# Patient Record
Sex: Male | Born: 1980 | Race: White | Hispanic: No | Marital: Single
Health system: Southern US, Community
[De-identification: ages and names within clinical notes are randomized; demographics above are authoritative.]

---

## 2018-05-04 ENCOUNTER — Emergency Department (HOSPITAL_COMMUNITY)
Admission: EM | Admit: 2018-05-04 | Discharge: 2018-05-04 | Disposition: A | Discharge status: Home / Self Care | Payer: Self-pay | Attending: Emergency Medicine | Admitting: Emergency Medicine

## 2018-05-04 ENCOUNTER — Encounter (HOSPITAL_COMMUNITY): Payer: Self-pay | Admitting: Emergency Medicine

## 2018-05-04 ENCOUNTER — Other Ambulatory Visit: Payer: Self-pay

## 2018-05-04 ENCOUNTER — Emergency Department (HOSPITAL_COMMUNITY): Payer: Self-pay

## 2018-05-04 DIAGNOSIS — W0110XA Fall on same level from slipping, tripping and stumbling with subsequent striking against unspecified object, initial encounter: Secondary | ICD-10-CM | POA: Insufficient documentation

## 2018-05-04 DIAGNOSIS — Y939 Activity, unspecified: Secondary | ICD-10-CM | POA: Insufficient documentation

## 2018-05-04 DIAGNOSIS — Y999 Unspecified external cause status: Secondary | ICD-10-CM | POA: Insufficient documentation

## 2018-05-04 DIAGNOSIS — S0990XA Unspecified injury of head, initial encounter: Secondary | ICD-10-CM | POA: Insufficient documentation

## 2018-05-04 DIAGNOSIS — Y92830 Public park as the place of occurrence of the external cause: Secondary | ICD-10-CM | POA: Insufficient documentation

## 2018-05-04 LAB — CBC WITH DIFFERENTIAL/PLATELET
Abs Immature Granulocytes: 0.02 10*3/uL (ref 0.00–0.07)
Basophils Absolute: 0 10*3/uL (ref 0.0–0.1)
Basophils Relative: 0 %
EOS ABS: 0 10*3/uL (ref 0.0–0.5)
EOS PCT: 1 %
HCT: 43.2 % (ref 39.0–52.0)
HEMOGLOBIN: 14.2 g/dL (ref 13.0–17.0)
Immature Granulocytes: 0 %
LYMPHS ABS: 0.5 10*3/uL — AB (ref 0.7–4.0)
LYMPHS PCT: 11 %
MCH: 30.1 pg (ref 26.0–34.0)
MCHC: 32.9 g/dL (ref 30.0–36.0)
MCV: 91.7 fL (ref 80.0–100.0)
Monocytes Absolute: 0.6 10*3/uL (ref 0.1–1.0)
Monocytes Relative: 12 %
Neutro Abs: 3.7 10*3/uL (ref 1.7–7.7)
Neutrophils Relative %: 76 %
Platelets: 93 10*3/uL — ABNORMAL LOW (ref 150–400)
RBC: 4.71 MIL/uL (ref 4.22–5.81)
RDW: 12 % (ref 11.5–15.5)
WBC: 4.9 10*3/uL (ref 4.0–10.5)
nRBC: 0 % (ref 0.0–0.2)

## 2018-05-04 LAB — BASIC METABOLIC PANEL
Anion gap: 17 — ABNORMAL HIGH (ref 5–15)
BUN: 9 mg/dL (ref 6–20)
CO2: 24 mmol/L (ref 22–32)
Calcium: 9.2 mg/dL (ref 8.9–10.3)
Chloride: 98 mmol/L (ref 98–111)
Creatinine, Ser: 0.91 mg/dL (ref 0.61–1.24)
GFR calc Af Amer: >60 mL/min (ref >60)
GFR calc non Af Amer: >60 mL/min (ref >60)
Glucose, Bld: 133 mg/dL — ABNORMAL HIGH (ref 70–99)
POTASSIUM: 2.9 mmol/L — AB (ref 3.5–5.1)
Sodium: 139 mmol/L (ref 135–145)

## 2018-05-04 LAB — CBG MONITORING, ED: Glucose-Capillary: 133 mg/dL — ABNORMAL HIGH (ref 70–99)

## 2018-05-04 MED ORDER — SODIUM CHLORIDE 0.9 % IV BOLUS
1000.0000 mL | Freq: Once | INTRAVENOUS | Status: AC
  Administered 2018-05-04: 1000 mL via INTRAVENOUS

## 2018-05-04 NOTE — ED Notes (Signed)
Checked patient cbg it was 133 notified RN of blood sugar 

## 2018-05-04 NOTE — ED Provider Notes (Addendum)
[facility] EMERGENCY DEPARTMENT Provider Note   CSN: [id number] Arrival date & time: 05/04/18  1243    History   Chief Complaint Chief Complaint  Patient presents with  . Fall    HPI Justin Moody is a 38 y.o. male.     38 yo M with a chief complaint of a fall.  Patient states he was drinking earlier this morning and he lost his balance and he fell.  EMS was called for some reason and he was taken to the ED.  He denies injury he denies current intoxication denies headache neck pain chest pain abdominal pain shortness of breath.  States he can ambulate without difficulty.  The history is provided by the patient.  Fall  This is a new problem. The current episode started 1 to 2 hours ago. The problem occurs rarely. The problem has been resolved. Pertinent negatives include no chest pain, no abdominal pain, no headaches and no shortness of breath. Nothing aggravates the symptoms. Nothing relieves the symptoms. He has tried nothing for the symptoms. The treatment provided no relief.    History reviewed. No pertinent past medical history.  There are no active problems to display for this patient.   History reviewed. No pertinent surgical history.      Home Medications    Prior to Admission medications   Not on File    Family History No family history on file.  Social History Social History   Tobacco Use  . Smoking status: Not on file  Substance Use Topics  . Alcohol use: Not on file  . Drug use: Not on file     Allergies   Patient has no known allergies.   Review of Systems Review of Systems  Constitutional: Negative for chills and fever.  HENT: Negative for congestion and facial swelling.   Eyes: Negative for discharge and visual disturbance.  Respiratory: Negative for shortness of breath.   Cardiovascular: Negative for chest pain and palpitations.  Gastrointestinal: Negative for abdominal pain, diarrhea and vomiting.    Musculoskeletal: Negative for arthralgias and myalgias.  Skin: Positive for wound. Negative for color change and rash.  Neurological: Negative for tremors, syncope and headaches.  Psychiatric/Behavioral: Negative for confusion and dysphoric mood.     Physical Exam Updated Vital Signs BP (!) 149/92   Pulse 96   Temp 98.3 F (36.8 C) (Oral)   Resp 16   SpO2 97%   Physical Exam Vitals signs and nursing note reviewed.  Constitutional:      Appearance: He is well-developed.  HENT:     Head: Normocephalic.     Comments: Superficial laceration to the left cheek  No midline spinal tenderness Eyes:     Pupils: Pupils are equal, round, and reactive to light.  Neck:     Musculoskeletal: Normal range of motion and neck supple.     Vascular: No JVD.  Cardiovascular:     Rate and Rhythm: Normal rate and regular rhythm.     Heart sounds: No murmur. No friction rub. No gallop.   Pulmonary:     Effort: No respiratory distress.     Breath sounds: No wheezing.  Abdominal:     General: There is no distension.     Tenderness: There is no guarding or rebound.  Musculoskeletal: Normal range of motion.  Skin:    Coloration: Skin is not pale.     Findings: No rash.  Neurological:     Mental Status: He is alert  and oriented to person, place, and time.  Psychiatric:        Behavior: Behavior normal.      ED Treatments / Results  Labs (all labs ordered are listed, but only abnormal results are displayed) Labs Reviewed  CBC WITH DIFFERENTIAL/PLATELET - Abnormal; Notable for the following components:      Result Value   Platelets 93 (*)    Lymphs Abs 0.5 (*)    All other components within normal limits  BASIC METABOLIC PANEL - Abnormal; Notable for the following components:   Potassium 2.9 (*)    Glucose, Bld 133 (*)    Anion gap 17 (*)    All other components within normal limits  CBG MONITORING, ED - Abnormal; Notable for the following components:   Glucose-Capillary 133 (*)     All other components within normal limits    EKG EKG Interpretation  Date/Time:  Tuesday May 04 2018 12:50:19 EST Ventricular Rate:  119 PR Interval:    QRS Duration: 92 QT Interval:  314 QTC Calculation: 442 R Axis:   97 Text Interpretation:  Sinus tachycardia Probable left atrial enlargement Anterolateral infarct, old st changes like rate related Otherwise no significant change Confirmed by Melene PlanFloyd, Serrena Linderman [phone]([id number]) on 05/04/2018 2:09:31 PM   Radiology Ct Head Wo Contrast  Result Date: 05/04/2018 CLINICAL DATA:  Pain following fall EXAM: CT HEAD WITHOUT CONTRAST TECHNIQUE: Contiguous axial images were obtained from the base of the skull through the vertex without intravenous contrast. COMPARISON:  None. FINDINGS: Brain: There is mild diffuse atrophy for age. There is no intracranial mass, hemorrhage, extra-axial fluid collection, or midline shift. Brain parenchyma appears unremarkable. No evident acute infarct. Vascular: There is no hyperdense vessel. There is no appreciable vascular calcification. Skull: The bony calvarium appears intact. Sinuses/Orbits: There is mucosal thickening involving several ethmoid air cells. Other visualized paranasal sinuses are clear. Orbits appear symmetric bilaterally. Other: Visualized mastoid air cells are clear. IMPRESSION: Mild generalized atrophy for age. Brain parenchyma appears unremarkable. No mass or hemorrhage. There is mucosal thickening in several ethmoid air cells. Electronically Signed   By: Bretta BangWilliam  Woodruff III M.D.   On: 05/04/2018 14:12    Procedures Procedures (including critical care time)  Medications Ordered in ED Medications  sodium chloride 0.9 % bolus 1,000 mL (0 mLs Intravenous Stopped 05/04/18 1356)     Initial Impression / Assessment and Plan / ED Course  I have reviewed the triage vital signs and the nursing notes.  Pertinent labs & imaging results that were available during my care of the patient were reviewed by me and  considered in my medical decision making (see chart for details).        38 yo M with a chief complaint of a fall.  Patient was brought in by EMS and due to confusion they have made him a level 2 trauma.  Patient endorses drinking earlier today does not appear to be confused on my exam.  CT of the head due to alcohol intoxication and head injury was negative.  Patient on reassessment continues to state that he is not drunk and able to ambulate without difficulty.  Did ambulate without difficulty will discharge the patient home.  Anion gap acidosis likely secondary to alcohol.   3:27 PM:  I have discussed the diagnosis/risks/treatment options with the patient and believe the pt to be eligible for discharge home to follow-up with PCP. We also discussed returning to the ED immediately if new or worsening sx occur. We  discussed the sx which are most concerning (e.g., sudden worsening pain, fever, inability to tolerate by mouth ) that necessitate immediate return. Medications administered to the patient during their visit and any new prescriptions provided to the patient are listed below.  Medications given during this visit Medications  sodium chloride 0.9 % bolus 1,000 mL (0 mLs Intravenous Stopped 05/04/18 1356)     The patient appears reasonably screen and/or stabilized for discharge and I doubt any other medical condition or other [facility] requiring further screening, evaluation, or treatment in the ED at this time prior to discharge.    Final Clinical Impressions(s) / ED Diagnoses   Final diagnoses:  Injury of head, initial encounter    ED Discharge Orders    None       Melene Plan, DO 05/04/18 1527    Melene Plan, DO 05/04/18 1528

## 2018-05-04 NOTE — ED Notes (Signed)
Patient verbalizes understanding of discharge instructions. Opportunity for questioning and answers were provided. Armband removed by staff, pt discharged from ED ambulatory.   

## 2018-05-04 NOTE — ED Notes (Signed)
Pt ambulated in hallway without difficulty or assistance  

## 2018-05-04 NOTE — ED Notes (Signed)
Got patient undress on the monitor did ekg shown to Dr floyd patient is resting with call bell in reach 

## 2018-05-04 NOTE — ED Triage Notes (Signed)
[place]. ETOH on board, pt's friend found him lying on the ground after losing track of him for approx 10 mins. C collar in place on arrival. Minor lac to L face.

## 2019-11-08 IMAGING — CT CT HEAD W/O CM
4 series · 16 of 47 positions shown, 18 images · non-contrast
Comparison: None.

CLINICAL DATA: Pain following fall

EXAM:
CT HEAD WITHOUT CONTRAST
TECHNIQUE: Contiguous axial images were obtained from the base of the skull
through the vertex without intravenous contrast.

[Series 3: head without · axial · non-contrast · 0.45mm/px · z∈[-150,-30]mm · 7 of 32 slices shown, 9 images]
[im 4/32  brain]
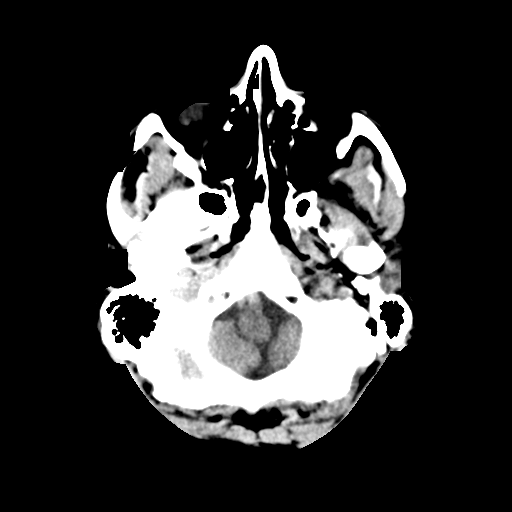
[im 4/32  bone]
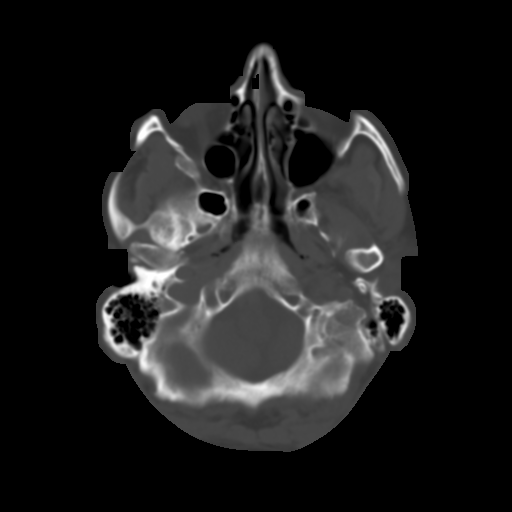
[im 8/32  brain]
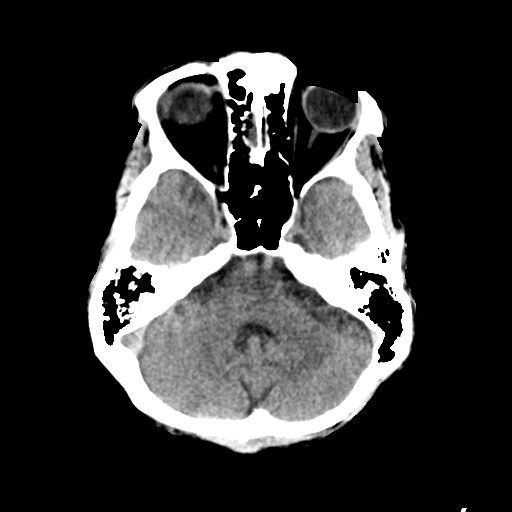
[im 12/32  brain]
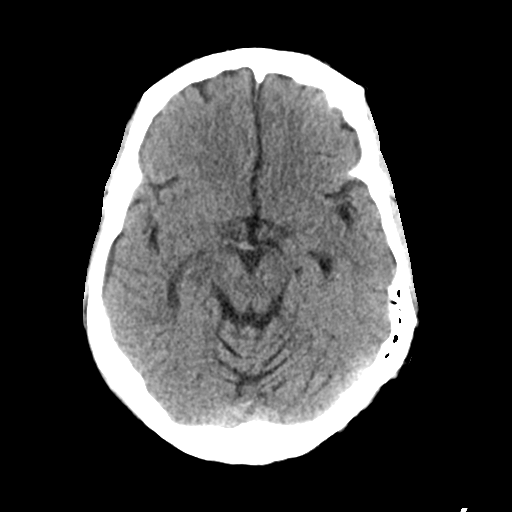
[im 16/32  brain]
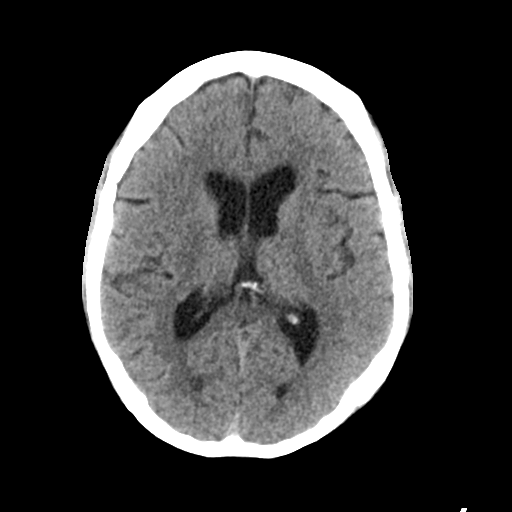
[im 20/32  brain]
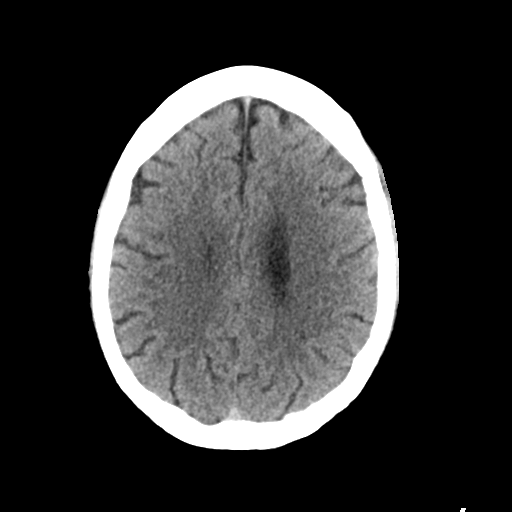
[im 20/32  bone]
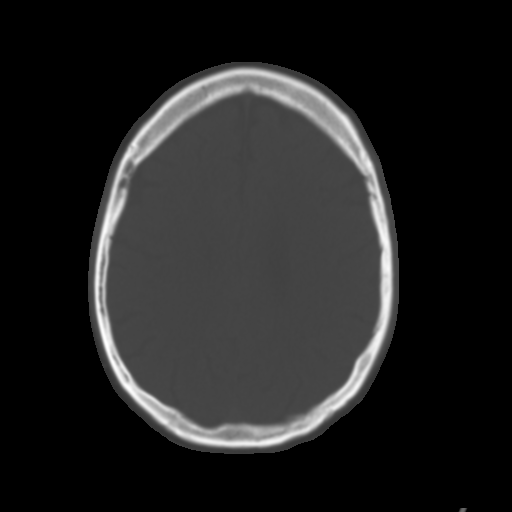
[im 24/32  brain]
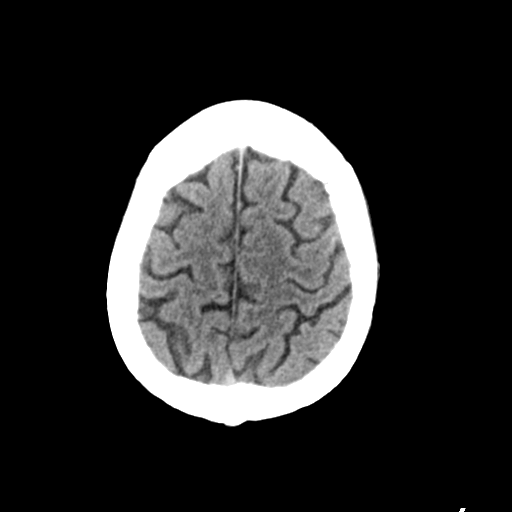
[im 28/32  brain]
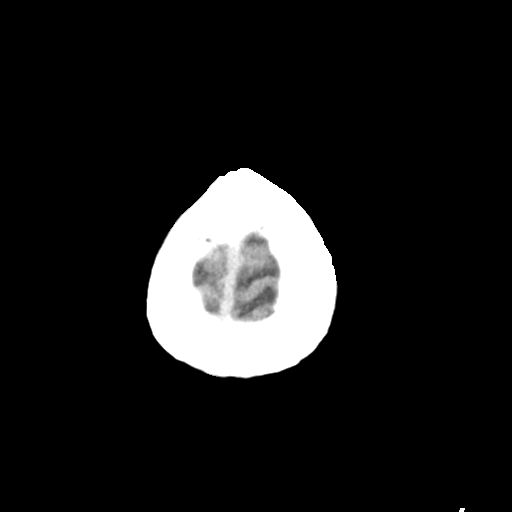

[Series 4: head bone · axial · 0.45mm/px · z∈[-152,-120]mm · 3 of 80 slices shown]
[im 8/80  bone]
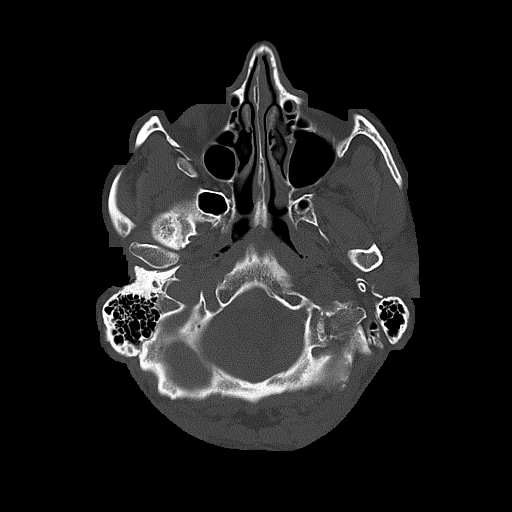
[im 16/80  bone]
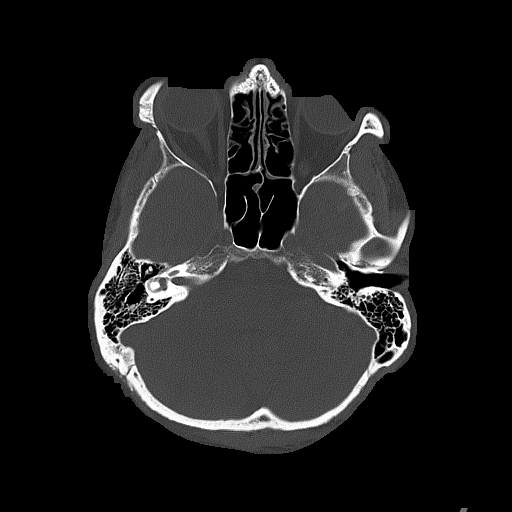
[im 24/80  bone]
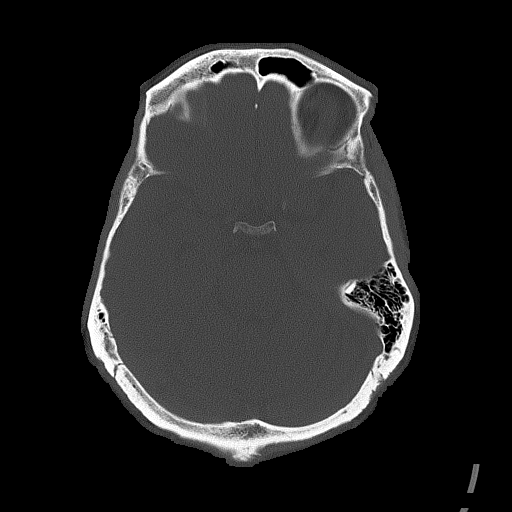

[Series 5: head without cor · coronal · non-contrast · 0.33mm/px · 3 of 67 slices shown]
[im 23/67  brain]
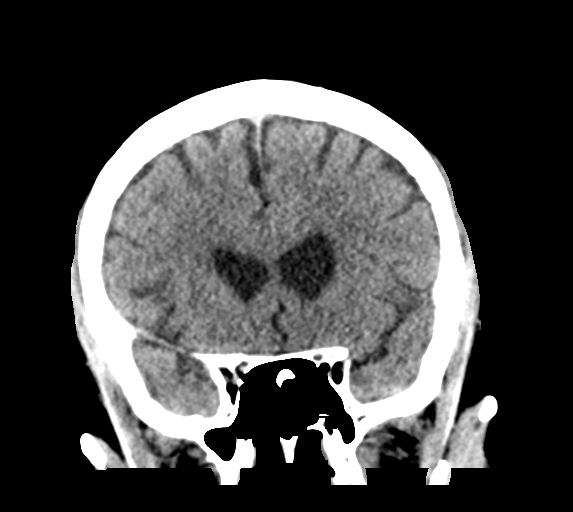
[im 30/67  brain]
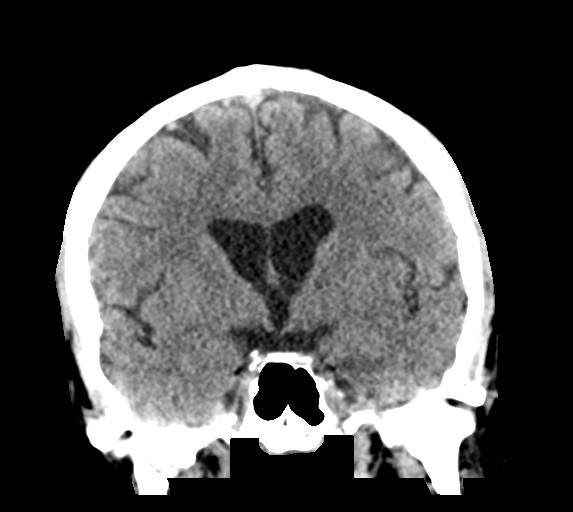
[im 37/67  brain]
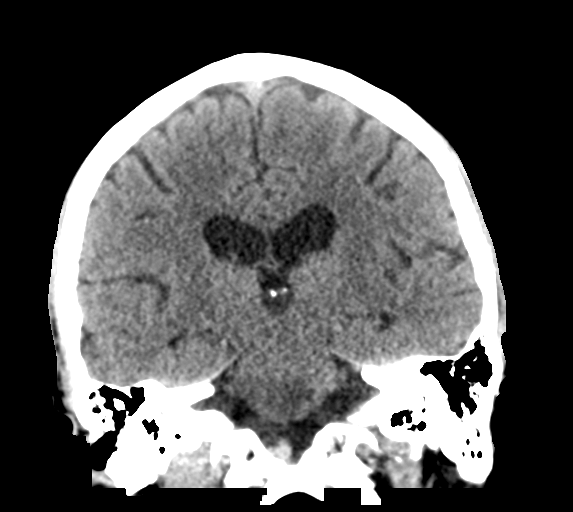

[Series 6: head without sag · sagittal · non-contrast · 0.32mm/px · 3 of 65 slices shown]
[im 22/65  brain]
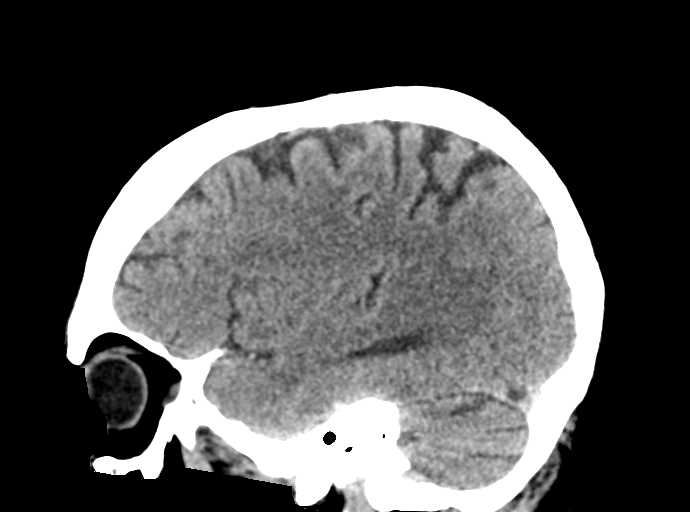
[im 33/65  brain]
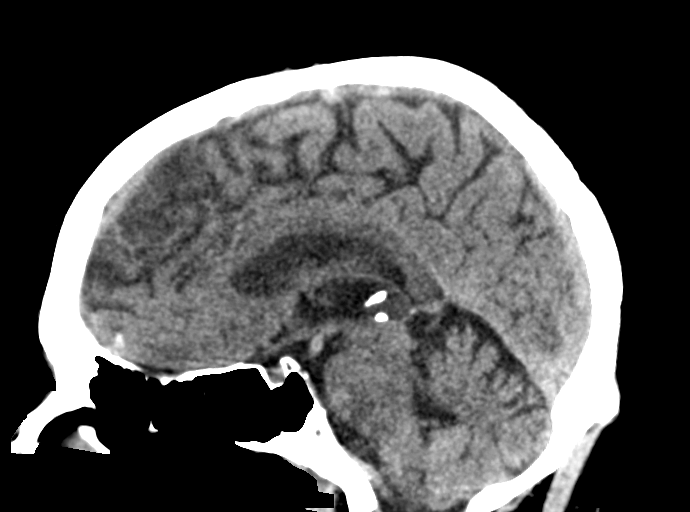
[im 43/65  brain]
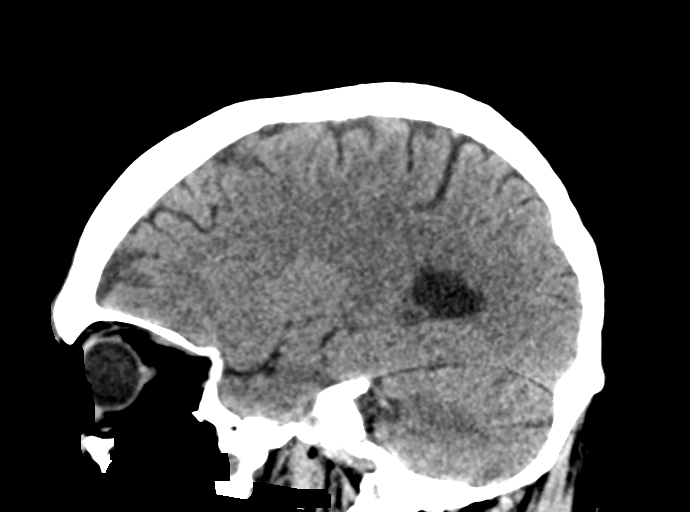

[16 of 47 positions shown; findings below may reference images not displayed]

FINDINGS: Brain: There is mild diffuse atrophy for age. There is no
intracranial mass, hemorrhage, extra-axial fluid collection, or
midline shift. Brain parenchyma appears unremarkable. No evident
acute infarct.

Vascular: There is no hyperdense vessel. There is no appreciable
vascular calcification.

Skull: The bony calvarium appears intact.

Sinuses/Orbits: There is mucosal thickening involving several
ethmoid air cells. Other visualized paranasal sinuses are clear.
Orbits appear symmetric bilaterally.

Other: Visualized mastoid air cells are clear.
IMPRESSION: Mild generalized atrophy for age. Brain parenchyma appears
unremarkable. No mass or hemorrhage.

There is mucosal thickening in several ethmoid air cells.
